# Patient Record
Sex: Female | Born: 1995 | Race: White | Hispanic: No | Marital: Single | Smoking: Never smoker
Health system: Southern US, Community
[De-identification: ages and names within clinical notes are randomized; demographics above are authoritative.]

## PROBLEM LIST (undated history)

## (undated) DIAGNOSIS — Z789 Other specified health status: Secondary | ICD-10-CM

## (undated) HISTORY — DX: Other specified health status: Z78.9

---
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
## 2018-06-02 LAB — HM PAP SMEAR: HM Pap smear: NEGATIVE

## 2019-03-16 ENCOUNTER — Ambulatory Visit (LOCAL_COMMUNITY_HEALTH_CENTER): Payer: Self-pay

## 2019-03-16 ENCOUNTER — Other Ambulatory Visit: Payer: Self-pay

## 2019-03-16 DIAGNOSIS — Z111 Encounter for screening for respiratory tuberculosis: Secondary | ICD-10-CM

## 2019-03-16 NOTE — Progress Notes (Signed)
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 Per [employer] records, pt had physical at [facility] on 06/02/2018. Donnal Moat, CNM completed form based on 06/02/2018 assessment; form sent for scanning. Pt to pick up original form on Friday when she comes back for PPDR.

## 2019-03-19 ENCOUNTER — Other Ambulatory Visit: Payer: Self-pay

## 2019-03-19 ENCOUNTER — Ambulatory Visit (LOCAL_COMMUNITY_HEALTH_CENTER): Payer: Self-pay

## 2019-03-19 DIAGNOSIS — Z111 Encounter for screening for respiratory tuberculosis: Secondary | ICD-10-CM

## 2019-03-19 LAB — TB SKIN TEST
Induration: 0 mm
TB Skin Test: NEGATIVE

## 2019-03-19 NOTE — Progress Notes (Signed)
Client received copy of physical form completed by E. Sciora CNM. Per attached note on form, form has been sent for scannng. Rich Number, RN

## 2020-01-28 ENCOUNTER — Other Ambulatory Visit: Payer: Self-pay

## 2020-01-28 ENCOUNTER — Ambulatory Visit: Payer: Self-pay

## 2020-01-28 ENCOUNTER — Ambulatory Visit (LOCAL_COMMUNITY_HEALTH_CENTER): Payer: BC Managed Care – PPO | Admitting: Physician Assistant

## 2020-01-28 VITALS — BP 97/61 | Ht 60.0 in | Wt 121.0 lb

## 2020-01-28 DIAGNOSIS — Z5321 Procedure and treatment not carried out due to patient leaving prior to being seen by health care provider: Secondary | ICD-10-CM

## 2020-01-28 NOTE — Progress Notes (Signed)
Patient had to leave due to work schedule before being seen by nurse and provider to finish visit patient was going to reschedule .......[facility]Charles [facility]

## 2020-01-28 NOTE — Progress Notes (Signed)
Patient with appointment for RP and Nexplanon removal/reinsertion today. 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  Offered option of staying for appointment and giving her work note vs rescheduling.  Patient opted to reschedule her appointment.

## 2020-02-04 ENCOUNTER — Ambulatory Visit: Payer: BC Managed Care – PPO

## 2020-02-04 ENCOUNTER — Ambulatory Visit (LOCAL_COMMUNITY_HEALTH_CENTER): Payer: BC Managed Care – PPO | Admitting: Physician Assistant

## 2020-02-04 ENCOUNTER — Encounter: Payer: Self-pay | Admitting: Physician Assistant

## 2020-02-04 ENCOUNTER — Other Ambulatory Visit: Payer: Self-pay

## 2020-02-04 VITALS — BP 97/62 | Ht 61.0 in | Wt 118.2 lb

## 2020-02-04 DIAGNOSIS — Z3009 Encounter for other general counseling and advice on contraception: Secondary | ICD-10-CM

## 2020-02-04 DIAGNOSIS — Z0000 Encounter for general adult medical examination without abnormal findings: Secondary | ICD-10-CM

## 2020-02-04 DIAGNOSIS — Z3046 Encounter for surveillance of implantable subdermal contraceptive: Secondary | ICD-10-CM | POA: Diagnosis not present

## 2020-02-04 MED ORDER — ETONOGESTREL 68 MG ~~LOC~~ IMPL
68.0000 mg | DRUG_IMPLANT | Freq: Once | SUBCUTANEOUS | Status: AC
  Administered 2020-02-04: 68 mg via SUBCUTANEOUS

## 2020-02-04 NOTE — Progress Notes (Signed)
Pt is here for physical and Nexplanon removal and reinsertion. Pt reports no issues with Nexplanon, but that device is expired and desires reinsertion. RN counseling for Nexplanon removal and reinsertion completed and consent forms reviewed and signed by pt.

## 2020-02-04 NOTE — Progress Notes (Signed)
Family Planning Visit- Repeat Yearly Visit  Subjective:  Tracy Alvarez is a 24 y.o. No obstetric history on file.  being seen today for an well woman visit and to discuss family planning options.    She is currently using Nexplanon for pregnancy prevention. Patient reports she does not  want a pregnancy in the next year. Patient  does not have a problem list on file.  Chief Complaint  Patient presents with  . Contraception    Physical, Nexplanon removal    Patient reports that she would like to have her Nexplanon removed and use condoms.  Per chart review, patient due for CBE and pap is due in 2022.  Patient denies any concerns today.    See flowsheet for other program required questions.   Body mass index is 22.33 kg/m. - Patient is eligible for diabetes screening based on BMI and age >72?  not applicable HA1C ordered? not applicable  Patient reports 1  partners in last year. Desires STI screening?  No - patient declines due to bleeding.   Has patient been screened once for HCV in the past?  No  No results found for: HCVAB  Does the patient have current of drug use, have a partner with drug use, and/or has been incarcerated since last result? No  If yes-- Screen for HCV through [facility]   Does the patient meet criteria for HBV testing? No  Criteria:  -Household, sexual or needle sharing contact with HBV -History of drug use -HIV positive -Those with known Hep C   Health Maintenance Due  Topic Date Due  . Hepatitis C Screening  Never done  . HIV Screening  Never done  . TETANUS/TDAP  Never done  . INFLUENZA VACCINE  Never done    Review of Systems  All other systems reviewed and are negative.   The following portions of the patient's history were reviewed and updated as appropriate: allergies, current medications, past family history, past medical history, past social history, past surgical history and problem list. Problem list updated.  Objective:    Vitals:   02/04/20 1350  BP: 97/62  Weight: 118 lb 3.2 oz (53.6 kg)  Height: 5\' 1"  (1.549 m)    Physical Exam Vitals and nursing note reviewed.  Constitutional:      General: She is not in acute distress.    Appearance: Normal appearance.  HENT:     Head: Normocephalic and atraumatic.  Eyes:     Conjunctiva/sclera: Conjunctivae normal.  Neck:     Thyroid: No thyroid mass, thyromegaly or thyroid tenderness.  Cardiovascular:     Rate and Rhythm: Normal rate and regular rhythm.  Pulmonary:     Effort: Pulmonary effort is normal.     Breath sounds: Normal breath sounds.  Chest:     Breasts:        Right: Normal. No mass, nipple discharge, skin change or tenderness.        Left: Normal. No mass, nipple discharge, skin change or tenderness.  Abdominal:     Palpations: Abdomen is soft. There is no mass.     Tenderness: There is no abdominal tenderness. There is no guarding or rebound.  Musculoskeletal:     Cervical back: Neck supple. No tenderness.  Lymphadenopathy:     Cervical: No cervical adenopathy.     Upper Body:     Right upper body: No supraclavicular, axillary or pectoral adenopathy.     Left upper body: No supraclavicular, axillary  or pectoral adenopathy.  Skin:    General: Skin is warm and dry.  Neurological:     Mental Status: She is alert and oriented to person, place, and time.  Psychiatric:        Mood and Affect: Mood normal.        Behavior: Behavior normal.        Thought Content: Thought content normal.        Judgment: Judgment normal.       Assessment and Plan:  Tracy Alvarez is a 24 y.o. female No obstetric history on file. presenting to the [facility] Department for an yearly well woman exam/family planning visit  Contraception counseling: Reviewed all forms of birth control options in the tiered based approach. available including abstinence; over the counter/barrier methods; hormonal contraceptive medication including pill,  patch, ring, injection,contraceptive implant, ECP; hormonal and nonhormonal IUDs; permanent sterilization options including vasectomy and the various tubal sterilization modalities. Risks, benefits, and typical effectiveness rates were reviewed.  Questions were answered.  Written information was also given to the patient to review.  Patient desires Nexplanon removal/reinsertion, this was prescribed for patient. She will follow up in  1 year and prn for surveillance.  She was told to call with any further questions, or with any concerns about this method of contraception.  Emphasized use of condoms 100% of the time for STI prevention.  Patient was not a candidate for ECP today.    1. Encounter for counseling regarding contraception Counseled patient re:  BCMs and patient then decides to have reinsertion. Rec condoms with all sex for 10 days after reinsertion and enc always for STD protection.  2. Well woman exam (no gynecological exam) Reviewed healthy habits for general health and to maintain normal BMI. Enc MVI 1 po daily. Enc to establish with/follow up with PCP for primary care concerns and illness.  3. Encounter for removal and reinsertion of Nexplanon Nexplanon Removal and Insertion  Patient identified, informed consent performed, consent signed.   Patient does understand that irregular bleeding is a very common side effect of this medication. She was advised to have backup contraception for one week after replacement of the implant. Patient deemed to meet WHO criteria for being reasonably certain she is not pregnant.  Appropriate time out taken. Nexplanon site identified. Area prepped in usual sterile fashon. 2 ml of 1% lidocaine with epinephrine was used to anesthetize the area at the distal end of the implant. A small stab incision was made right beside the implant on the distal portion. The Nexplanon rod was grasped manually and removed without difficulty. There was minimal blood loss. There  were no complications.   Confirmed correct location of insertion site. The insertion site was identified 8-10 cm (3-4 inches) from the medial epicondyle of the humerus and 3-5 cm (1.25-2 inches) posterior to (below) the sulcus (groove) between the biceps and triceps muscles of the patient's left arm. New Nexplanon removed from packaging, Device confirmed in needle, then inserted full length of needle and withdrawn per handbook instructions. Nexplanon was able to palpated in the patient's left arm; patient palpated the insert herself.  There was minimal blood loss. Patient insertion site covered with guaze and a pressure bandage to reduce any bruising. The patient tolerated the procedure well and was given post procedure instructions.   Nexplanon:   Counseled patient to take OTC analgesic starting as soon as lidocaine starts to wear off and take regularly for at least 48 hr to  decrease discomfort.  Specifically to take with food or milk to decrease stomach upset and for IB 600 mg (3 tablets) every 6 hrs; IB 800 mg (4 tablets) every 8 hrs; or Aleve 2 tablets every 12 hrs.   - etonogestrel (NEXPLANON) implant 68 mg     Return in about 1 year (around 02/03/2021) for RP and prn.  No future appointments.  Matt Holmes, PA

## 2021-06-19 ENCOUNTER — Ambulatory Visit (LOCAL_COMMUNITY_HEALTH_CENTER): Payer: 59 | Admitting: Family Medicine

## 2021-06-19 ENCOUNTER — Other Ambulatory Visit: Payer: Self-pay

## 2021-06-19 VITALS — BP 90/53 | HR 74 | Temp 98.3°F | Resp 16 | Ht 61.0 in | Wt 139.0 lb

## 2021-06-19 DIAGNOSIS — Z3009 Encounter for other general counseling and advice on contraception: Secondary | ICD-10-CM | POA: Diagnosis not present

## 2021-06-19 DIAGNOSIS — Z3049 Encounter for surveillance of other contraceptives: Secondary | ICD-10-CM | POA: Diagnosis not present

## 2021-06-19 DIAGNOSIS — Z113 Encounter for screening for infections with a predominantly sexual mode of transmission: Secondary | ICD-10-CM

## 2021-06-19 DIAGNOSIS — Z01419 Encounter for gynecological examination (general) (routine) without abnormal findings: Secondary | ICD-10-CM

## 2021-06-19 LAB — WET PREP FOR TRICH, YEAST, CLUE
Trichomonas Exam: NEGATIVE
Yeast Exam: NEGATIVE

## 2021-06-19 NOTE — Progress Notes (Signed)
Patient here for annual exam and Nexplanon removal.   Nexplanon was placed on 02/04/20 t [facility] and removed today by provider K. Alvester Morin, MD. After-care instructions given to patient and patient verbalized understating. Patient to practice abstinence - abstinence form filled.   MyChart set up today and patient aware to check there for results for PAP and gonorrhea/chlamydia.   Wet mount reviewed during clinic visit - no treatment indicated.   Floy Sabina, RN

## 2021-06-19 NOTE — Progress Notes (Signed)
[name]    Family Planning Visit- Initial Visit  Subjective:  Tracy Alvarez is a 26 y.o.  [id number]   being seen today for an initial annual visit and to discuss reproductive life planning.  The patient is currently using No Method - Other Reason for pregnancy prevention. Patient reports   does not want a pregnancy in the next year.  Patient has the following medical conditions does not have a problem list on file.  Chief Complaint  Patient presents with   Annual Exam   Contraception    Patient report no changes in health status.     Body mass index is 26.26 kg/m. - Patient is eligible for diabetes screening based on BMI and age >40?  no HA1C ordered? no  Patient reports 1  partner/s in last year. Desires STI screening?  Yes  Has patient been screened once for HCV in the past?  No  No results found for: HCVAB  Does the patient have current drug use (including MJ), have a partner with drug use, and/or has been incarcerated since last result? No - declined blood work screening today If yes-- Screen for HCV through [facility]   Does the patient meet criteria for HBV testing? No  Criteria:  -Household, sexual or needle sharing contact with HBV -History of drug use -HIV positive -Those with known Hep C   Health Maintenance Due  Topic Date Due   COVID-19 Vaccine (1) Never done   HPV VACCINES (1 - 2-dose series) Never done   HIV Screening  Never done   Hepatitis C Screening  Never done   TETANUS/TDAP  Never done   INFLUENZA VACCINE  Never done   PAP-Cervical Cytology Screening  06/02/2021   PAP SMEAR-Modifier  06/02/2021    Review of Systems  Constitutional:  Negative for chills and fever.  Eyes:  Negative for blurred vision and double vision.  Respiratory:  Negative for cough and shortness of breath.   Cardiovascular:  Negative for chest pain and orthopnea.   Gastrointestinal:  Negative for nausea and vomiting.  Genitourinary:  Negative for dysuria, flank pain and frequency.  Musculoskeletal:  Negative for myalgias.  Skin:  Negative for rash.  Neurological:  Negative for dizziness, tingling, weakness and headaches.  Endo/Heme/Allergies:  Does not bruise/bleed easily.  Psychiatric/Behavioral:  Negative for depression and suicidal ideas. The patient is not nervous/anxious.    The following portions of the patient's history were reviewed and updated as appropriate: allergies, current medications, past family history, past medical history, past social history, past surgical history and problem list. Problem list updated.   See flowsheet for other program required questions.  Objective:   Vitals:   06/19/21 1505  BP: (!) 90/53  Pulse: 74  Resp: 16  Temp: 98.3 F (36.8 C)  TempSrc: Oral  Weight: 139 lb (63 kg)  Height: 5\' 1"  (1.549 m)    Physical Exam Constitutional:      Appearance: Normal appearance.  HENT:     Head: Normocephalic and atraumatic.  Pulmonary:     Effort: Pulmonary effort is normal.  Abdominal:     Palpations: Abdomen is soft.  Musculoskeletal:        General: Normal range of motion.  Skin:    General: Skin is warm and dry.  Neurological:     General: No focal deficit present.     Mental Status: She is alert.  Psychiatric:        Mood and Affect: Mood normal.        Behavior: Behavior normal.    Nexplanon Removal Patient identified, informed consent performed, consent signed.   Appropriate time out taken. Nexplanon site identified.  Area prepped in usual sterile fashon. 3 ml of 1% lidocaine with Epinephrine was used to anesthetize the area at the distal end of the implant and along implant site. A small stab incision was made right beside the implant on the distal portion.  The Nexplanon rod was grasped using hemostats and removed without difficulty.  There was minimal blood loss. There were no complications.   Steri-strips were applied over the small incision.  A pressure bandage was applied to reduce any bruising.  The patient tolerated the procedure well and was given post procedure instructions.      Assessment and Plan:  Tracy Alvarez is a 26 y.o. female presenting to the [facility] Department for an initial annual wellness/contraceptive visit  Contraception counseling: Reviewed all forms of birth control options in the tiered based approach. available including abstinence; over the counter/barrier methods; hormonal contraceptive medication including pill, patch, ring, injection,contraceptive implant, ECP; hormonal and nonhormonal IUDs; permanent sterilization options including vasectomy and the various tubal sterilization modalities. Risks, benefits, and typical effectiveness rates were reviewed.  Questions were answered.  Written information was also given to the patient to review.  Patient desires Abstinence, this was prescribed for patient.    The patient will follow up in  58yr for surveillance.  The patient was told to call with any further questions, or with any concerns about this method of contraception.  Emphasized use of condoms 100% of the time for STI prevention.  1. Screening examination for venereal disease - WET PREP FOR [place], YEAST, CLUE  2. Well woman exam with routine gynecological exam Breast exam not indicated Pap today Desires STI screening- reports vaginal discharge - Chlamydia/Gonorrhea [facility] - IGP, rfx Aptima HPV ASCU     Return in about 1 year (around 06/19/2022) for Yearly wellness exam.  No future appointments.  Caren Macadam, MD

## 2021-06-21 LAB — IGP, RFX APTIMA HPV ASCU: PAP Smear Comment: 0

## 2021-12-28 ENCOUNTER — Ambulatory Visit: Admit: 2021-12-28 | Payer: Self-pay

## 2022-12-11 ENCOUNTER — Ambulatory Visit (LOCAL_COMMUNITY_HEALTH_CENTER): Payer: Medicaid Other | Admitting: Advanced Practice Midwife

## 2022-12-11 ENCOUNTER — Encounter: Payer: Self-pay | Admitting: Advanced Practice Midwife

## 2022-12-11 VITALS — BP 101/67 | HR 55 | Ht 61.0 in | Wt 130.0 lb

## 2022-12-11 DIAGNOSIS — Z309 Encounter for contraceptive management, unspecified: Secondary | ICD-10-CM

## 2022-12-11 DIAGNOSIS — Z3009 Encounter for other general counseling and advice on contraception: Secondary | ICD-10-CM

## 2022-12-11 DIAGNOSIS — Z01419 Encounter for gynecological examination (general) (routine) without abnormal findings: Secondary | ICD-10-CM

## 2022-12-11 DIAGNOSIS — F1290 Cannabis use, unspecified, uncomplicated: Secondary | ICD-10-CM | POA: Insufficient documentation

## 2022-12-11 NOTE — Progress Notes (Signed)
[facility] DEPARTMENT [facility] [address] Main Number: [phone]   Family Planning Visit- Initial Visit  Subjective:  Tracy Alvarez is a 27 y.o. SWF nonsmoker G0P0000   being seen today for an initial annual visit and to discuss reproductive life planning.  The patient is currently using No Method - Other Reason for pregnancy prevention. Patient reports   does not want a pregnancy in the next year.     report they are looking for a method that provides Other doesn't want any birth control  Patient has the following medical conditions has Marijuana use on their problem list.  Chief Complaint  Patient presents with   Annual Exam    Patient reports here for physical only. LMP yesterday. Last sex 12/08/22 without condom; with current partner x 5 mo; 1 partner in last 3 mo. Doesn't want pregnancy in next year. Last MJ yesterday. Last ETOH 12/07/22 (5 shots Tequila) q weekends. Working 40 hrs/wk and living alone. Last dental exam 01/2022. Last PE 06/19/21. Last pap 06/19/21 neg.   Patient denies cigs, vaping, cigars  Body mass index is 24.56 kg/m. - Patient is eligible for diabetes screening based on BMI> 25 and age >35?  not applicable HA1C ordered? no  Patient reports 1  partner/s in last year. Desires STI screening?  No - declines bloodwork  Has patient been screened once for HCV in the past?  No  No results found for: "HCVAB"  Does the patient have current drug use (including MJ), have a partner with drug use, and/or has been incarcerated since last result? Yes  If yes-- Screen for HCV through [facility]   Does the patient meet criteria for HBV testing? Yes  Criteria:  -Household, sexual or needle sharing contact with HBV -History of drug use -HIV positive -Those with known Hep C   Health Maintenance Due  Topic Date Due   COVID-19 Vaccine (1) Never done   HIV Screening  Never done   Hepatitis C Screening  Never done    DTaP/Tdap/Td (1 - Tdap) Never done    Review of Systems  All other systems reviewed and are negative.   The following portions of the patient's history were reviewed and updated as appropriate: allergies, current medications, past family history, past medical history, past social history, past surgical history and problem list. Problem list updated.   See flowsheet for other program required questions.  Objective:   Vitals:   12/11/22 1436  BP: 101/67  Pulse: (!) 55  Weight: 130 lb (59 kg)  Height: 5\' 1"  (1.549 m)    Physical Exam Constitutional:      Appearance: Normal appearance. She is normal weight.  HENT:     Head: Normocephalic and atraumatic.     Mouth/Throat:     Mouth: Mucous membranes are moist.     Comments: Good dentition; last dental exam 01/2022 Eyes:     Conjunctiva/sclera: Conjunctivae normal.  Neck:     Thyroid: No thyroid mass, thyromegaly or thyroid tenderness.  Cardiovascular:     Rate and Rhythm: Normal rate and regular rhythm.  Pulmonary:     Effort: Pulmonary effort is normal.     Breath sounds: Normal breath sounds.  Chest:  Breasts:    Right: Normal.     Left: Normal.  Abdominal:     General: Abdomen is flat.     Palpations: Abdomen is soft.     Comments: Soft without masses or tenderness,  good tone  Genitourinary:    Rectum: Normal.     Comments: Pt states began menses yesterday and wants to return next week for wet mount and GC/Chlamydia; pap not due Musculoskeletal:        General: Normal range of motion.     Cervical back: Normal range of motion and neck supple.  Skin:    General: Skin is warm and dry.  Neurological:     Mental Status: She is alert.  Psychiatric:        Mood and Affect: Mood normal.      Assessment and Plan:  Tracy Alvarez is a 27 y.o. female presenting to the [facility] Department for an initial annual wellness/contraceptive visit  Contraception counseling: Reviewed options based on patient  desire and reproductive life plan. Patient is interested in Female Condom. This was provided to the patient today.  if not why not clearly documented  Risks, benefits, and typical effectiveness rates were reviewed.  Questions were answered.  Written information was also given to the patient to review.    The patient will follow up in  1 weeks for surveillance.  The patient was told to call with any further questions, or with any concerns about this method of contraception.  Emphasized use of condoms 100% of the time for STI prevention.  Educated on ECP and assessed for need of ECP. Patient reported > 120 hours .  Reviewed options and patient desired No method of ECP, declined all    1. Family planning Pt desires to return after menses completed in 1 week for STD testing and bimanual exam Please give pt condoms   Return if symptoms worsen or fail to improve.  No future appointments.  Alberteen Spindle, CNM

## 2022-12-18 ENCOUNTER — Ambulatory Visit: Payer: Self-pay

## 2022-12-31 ENCOUNTER — Ambulatory Visit: Payer: Self-pay | Admitting: Advanced Practice Midwife

## 2022-12-31 ENCOUNTER — Encounter: Payer: Self-pay | Admitting: Advanced Practice Midwife

## 2022-12-31 DIAGNOSIS — Z113 Encounter for screening for infections with a predominantly sexual mode of transmission: Secondary | ICD-10-CM

## 2022-12-31 LAB — HM HIV SCREENING LAB: HM HIV Screening: NEGATIVE

## 2022-12-31 NOTE — Progress Notes (Signed)
[facility] Department  STI clinic/screening visit [address] [place] [address] [phone]  Subjective:  Tracy Alvarez is a 27 y.o. SWF nullip nonsmoker female being seen today for an STI screening visit. The patient reports they do not have symptoms.  Patient reports that they do not desire a pregnancy in the next year.   They reported they are not interested in discussing contraception today.    Patient's last menstrual period was 12/11/2022 (exact date).  Patient has the following medical conditions:   Patient Active Problem List   Diagnosis Date Noted   Marijuana use 12/11/2022    Chief Complaint  Patient presents with   SEXUALLY TRANSMITTED DISEASE    HPI  Patient reports asymptomatic. LMP 12/11/22. Last sex yesterday with condom; with current partner x 5 mo; 1 partner in last 3 mo. Last ETOH 12/29/22 (4 Mimosas) q weekend. Last MJ 12/29/22. Last pap 06/19/21 neg  Does the patient using douching products? No  Last HIV test per patient/review of record was No results found for: "HMHIVSCREEN" No results found for: "HIV" Patient reports last pap was No results found for: "DIAGPAP"  Lab Results  Component Value Date   SPECADGYN Comment 06/19/2021    Screening for MPX risk: Does the patient have an unexplained rash? No Is the patient MSM? No Does the patient endorse multiple sex partners or anonymous sex partners? No Did the patient have close or sexual contact with a person diagnosed with MPX? No Has the patient traveled outside the [place] where MPX is endemic? No Is there a high clinical suspicion for MPX-- evidenced by one of the following No  -Unlikely to be chickenpox  -Lymphadenopathy  -Rash that present in same phase of evolution on any given body part See flowsheet for further details and programmatic requirements.   Immunization history:  Immunization History  Administered Date(s) Administered   PPD Test 03/16/2019     The  following portions of the patient's history were reviewed and updated as appropriate: allergies, current medications, past medical history, past social history, past surgical history and problem list.  Objective:  There were no vitals filed for this visit.  Physical Exam Vitals and nursing note reviewed.  Constitutional:      Appearance: Normal appearance. Tracy Alvarez is normal weight.  HENT:     Head: Normocephalic and atraumatic.     Mouth/Throat:     Mouth: Mucous membranes are moist.     Pharynx: Oropharynx is clear. No oropharyngeal exudate or posterior oropharyngeal erythema.  Eyes:     Conjunctiva/sclera: Conjunctivae normal.  Pulmonary:     Effort: Pulmonary effort is normal.  Abdominal:     General: Abdomen is flat.     Palpations: Abdomen is soft. There is no mass.     Tenderness: There is no abdominal tenderness. There is no rebound.     Comments: Soft without masses or tenderness, good tone  Genitourinary:    General: Normal vulva.     Exam position: Lithotomy position.     Pubic Area: No rash or pubic lice.      Labia:        Right: No rash or lesion.        Left: No rash or lesion.      Vagina: Vaginal discharge (white creamy leukorrhea, ph<4.5) present. No erythema, bleeding or lesions.     Cervix: No cervical motion tenderness, discharge, friability, lesion or erythema.     Uterus: Normal.  Adnexa: Right adnexa normal and left adnexa normal.     Rectum: Normal.     Comments: pH = <4.5 Lymphadenopathy:     Head:     Right side of head: No preauricular or posterior auricular adenopathy.     Left side of head: No preauricular or posterior auricular adenopathy.     Cervical: No cervical adenopathy.     Right cervical: No superficial, deep or posterior cervical adenopathy.    Left cervical: No superficial, deep or posterior cervical adenopathy.     Upper Body:     Right upper body: No supraclavicular, axillary or epitrochlear adenopathy.     Left upper body: No  supraclavicular, axillary or epitrochlear adenopathy.     Lower Body: No right inguinal adenopathy. No left inguinal adenopathy.  Skin:    General: Skin is warm and dry.     Findings: No rash.  Neurological:     Mental Status: Tracy Alvarez is alert and oriented to person, place, and time.      Assessment and Plan:  VANNETTE HELMICK is a 27 y.o. female presenting to the [facility] Department for STI screening  1. Screening examination for venereal disease Treat wet mount per standing orders Immunization nurse consult  - HIV [place] [facility] - Syphilis Serology, [employer] [facility] - Chlamydia/Gonorrhea [place] [facility] - Gonococcus culture - WET PREP FOR TRICH, YEAST, CLUE   Patient accepted all screenings including oral, vaginal CT/GC and bloodwork for HIV/RPR, and wet prep. Patient meets criteria for HepB screening? No. Ordered? no Patient meets criteria for HepC screening? No. Ordered? no  Treat wet prep per standing order Discussed time line for State Lab results and that patient will be called with positive results and encouraged patient to call if Tracy Alvarez had not heard in 2 weeks.  Counseled to return or seek care for continued or worsening symptoms Recommended repeat testing in 3 months with positive results. Recommended condom use with all sex  Patient is currently using  condoms  to prevent pregnancy.    Return if symptoms worsen or fail to improve.  No future appointments.  Alberteen Spindle, CNM

## 2022-12-31 NOTE — Progress Notes (Signed)
Wet prep reviewed with provider. Per provider, no treatment indicated.Burt Knack, RN

## 2023-01-15 ENCOUNTER — Telehealth: Payer: Self-pay

## 2023-01-15 NOTE — Telephone Encounter (Signed)
Pt notified of positive Chlamydia results. Treatment appointment made for 8/15 @ 1:00 pm.  Berdie Ogren, RN

## 2023-01-16 ENCOUNTER — Ambulatory Visit: Payer: Self-pay

## 2023-01-16 ENCOUNTER — Telehealth: Payer: Self-pay

## 2023-01-16 DIAGNOSIS — A749 Chlamydial infection, unspecified: Secondary | ICD-10-CM

## 2023-01-16 MED ORDER — AZITHROMYCIN 500 MG PO TABS
1000.0000 mg | ORAL_TABLET | Freq: Once | ORAL | Status: AC
Start: 2023-01-16 — End: 2023-01-16
  Administered 2023-01-16: 1000 mg via ORAL

## 2023-01-16 NOTE — Progress Notes (Signed)
Pt is here for treatment of Chlamydia.   Pt states that she has had unprotected sex. The patient was dispensed Azithromycin 500 mg #2 today. I provided counseling today regarding the medication. We discussed the medication, the side effects and when to call clinic. Patient given the opportunity to ask questions. Questions answered.   Chlamydia pamphlet and condoms given.  Berdie Ogren, RN

## 2023-01-16 NOTE — Telephone Encounter (Signed)
LM for pt to return my call

## 2023-01-16 NOTE — Telephone Encounter (Signed)
Pt says she threw up her treatment and wants to know what to do know, please call back

## 2023-01-17 ENCOUNTER — Telehealth: Payer: Self-pay | Admitting: Family Medicine

## 2023-01-17 NOTE — Telephone Encounter (Signed)
Pt states that she vomited about 2 hours and 15 minutes after taking her dose.  Informed pt that she doesn't need additional treatment at this time, however, she will need to be retested in 1-3 months.  Berdie Ogren, RN

## 2023-01-17 NOTE — Telephone Encounter (Signed)
Patient had an STI apt with CB and she would like CB to give her a call.

## 2023-10-14 ENCOUNTER — Encounter: Payer: Self-pay | Admitting: Family Medicine

## 2023-10-14 ENCOUNTER — Ambulatory Visit: Payer: Self-pay

## 2023-10-14 DIAGNOSIS — Z113 Encounter for screening for infections with a predominantly sexual mode of transmission: Secondary | ICD-10-CM

## 2023-10-14 LAB — WET PREP FOR TRICH, YEAST, CLUE
Clue Cell Exam: NEGATIVE
Trichomonas Exam: NEGATIVE
Yeast Exam: NEGATIVE

## 2023-10-14 NOTE — Progress Notes (Signed)
 [facility] Department STI clinic [address], [address] [place] [address] Main phone: [phone]  STI screening visit  Subjective:  Tracy Alvarez is a 28 y.o. female being seen today for an STI screening visit. The patient reports they do have symptoms.  Patient reports that they do not desire a pregnancy in the next year.   They reported they are not interested in discussing contraception today.    Patient's last menstrual period was 09/20/2023 (approximate).  Patient has the following medical conditions:  Patient Active Problem List   Diagnosis Date Noted   Marijuana use 12/11/2022   Chief Complaint  Patient presents with   SEXUALLY TRANSMITTED DISEASE    HPI Patient reports to clinic with c/o white discharge. States she was told last time to "eat yogurt".  Does the patient using douching products? No  See flowsheet for further details and programmatic requirements Hyperlink available at the top of the signed note in blue.  Flow sheet content below:  Pregnancy Intention Screening Does the patient want to become pregnant in the next year?: No Does the patient's partner want to become pregnant in the next year?: No Would the patient like to discuss contraceptive options today?: No Reason For STD Screen STD Screening: Has symptoms Have you ever had an STD?: No History of Antibiotic use in the past 2 weeks?: No STD Symptoms Discharge: Yes Risk Factors for Hep B Household, sexual, or needle sharing contact of a person infected with Hep B: No Sexual contact with a person who uses drugs not as prescribed?: No Currently or Ever used drugs not as prescribed: No HIV Positive: No PRep Patient: No Men who have sex with men: N/A Have Hepatitis C: No History of Incarceration: No History of Homeslessness?: No Anal sex following anal drug use?: No Risk Factors for Hep C Currently using drugs not as prescribed: No Sexual partner(s) currently  using drugs as not prescribed: No History of drug use: No HIV Positive: No People with a history of incarceration: No People born between the years of 55 and 68: N/A Abuse History Has patient ever been abused physically?: No Has patient ever been abused sexually?: No Does patient feel they have a problem with Anxiety?: Yes Does patient feel they have a problem with Depression?: No Referral to Behavioral Health: Declined Counseling Patient counseled to use condoms with all sex: Condoms declined RTC in 2-3 weeks for test results: Yes Clinic will call if test results abnormal before test result appt.: Yes Test results given to patient Patient counseled to use condoms with all sex: Condoms declined   Screening for MPX risk: Does the patient have an unexplained rash? No Is the patient MSM? No Does the patient endorse multiple sex partners or anonymous sex partners? No Did the patient have close or sexual contact with a person diagnosed with MPX? No Has the patient traveled outside the [place]  where MPX is endemic? No Is there a high clinical suspicion for MPX-- evidenced by one of the following No  -Unlikely to be chickenpox  -Lymphadenopathy  -Rash that present in same phase of evolution on any given body part  Screenings: Last HIV test per patient/review of record was  Lab Results  Component Value Date   HMHIVSCREEN Negative - Validated 12/31/2022   No results found for: "HIV"   Last HEPC test per patient/review of record was No results found for: "HMHEPCSCREEN" No components found for: "HEPC"   Last HEPB test per patient/review of  record was No components found for: "HMHEPBSCREEN"   Patient reports last pap was:   Lab Results  Component Value Date   SPECADGYN Comment 06/19/2021   Result Date Procedure Results Follow-ups  06/19/2021 IGP, rfx Aptima HPV ASCU DIAGNOSIS:: Comment Specimen adequacy:: Comment Clinician Provided ICD10: Comment Performed by:: Comment PAP Smear  Comment: . Note:: Comment Test Methodology: Comment PAP Reflex: Comment   06/02/2018 HM PAP SMEAR HM Pap smear: Negative     Immunization history:  Immunization History  Administered Date(s) Administered   Hepatitis A, Ped/Adol-2 Dose 01/26/2007   Hepatitis B, PED/ADOLESCENT 1995/06/21, 08/21/1995, 04/22/1996   MMR 07/27/1996, 10/15/2000   PPD Test 03/16/2019   Tdap 01/26/2007   Varicella 07/27/1996, 01/26/2007    The following portions of the patient's history were reviewed and updated as appropriate: allergies, current medications, past medical history, past social history, past surgical history and problem list.  Objective:  There were no vitals filed for this visit.  Physical Exam Vitals and nursing note reviewed. Exam conducted with a chaperone present Susanna Epley CNA).  Constitutional:      Appearance: Normal appearance.  HENT:     Head: Normocephalic and atraumatic.     Mouth/Throat:     Mouth: Mucous membranes are moist.     Pharynx: Oropharynx is clear. No oropharyngeal exudate or posterior oropharyngeal erythema.  Pulmonary:     Effort: Pulmonary effort is normal.  Abdominal:     General: Abdomen is flat.     Palpations: There is no mass.     Tenderness: There is no abdominal tenderness. There is no rebound.  Genitourinary:    General: Normal vulva.     Exam position: Lithotomy position.     Pubic Area: No rash or pubic lice.      Labia:        Right: No rash or lesion.        Left: No rash or lesion.      Vagina: Vaginal discharge present. No erythema, bleeding or lesions.     Cervix: No cervical motion tenderness, discharge, friability, lesion or erythema.     Uterus: Normal.      Adnexa: Right adnexa normal and left adnexa normal.     Rectum: Normal.     Comments: pH = 4  Small amt of discharge present  Lymphadenopathy:     Head:     Right side of head: No preauricular or posterior auricular adenopathy.     Left side of head: No preauricular or  posterior auricular adenopathy.     Cervical: No cervical adenopathy.     Upper Body:     Right upper body: No supraclavicular, axillary or epitrochlear adenopathy.     Left upper body: No supraclavicular, axillary or epitrochlear adenopathy.     Lower Body: No right inguinal adenopathy. No left inguinal adenopathy.  Skin:    General: Skin is warm and dry.     Findings: No rash.  Neurological:     Mental Status: She is alert and oriented to person, place, and time.     Assessment and Plan:  ANTWANIQUE JUNCKER is a 28 y.o. female presenting to the [facility] Department for STI screening  1. Screening for venereal disease (Primary)  - WET PREP FOR TRICH, YEAST, CLUE - Chlamydia/Gonorrhea [place] [facility]   Patient accepted the following screenings: vaginal CT/GC swabs and vaginal wet prep Patient meets criteria for HepB screening? No. Ordered? not applicable Patient meets criteria for HepC  screening? No. Ordered? not applicable  Treat wet prep per standing order Discussed time line for State Lab results and that patient will be called with positive results and encouraged patient to call if she had not heard in 2 weeks.  Counseled to return or seek care for continued or worsening symptoms Recommended repeat testing in 3 months with positive results. Recommended condom use with all sex for STI prevention.   Patient is currently using nothing to prevent pregnancy.    No follow-ups on file.  No future appointments.  Earleen Glazier, [place]

## 2023-10-20 ENCOUNTER — Ambulatory Visit: Payer: Self-pay | Admitting: Family Medicine

## 2023-10-28 ENCOUNTER — Telehealth: Payer: Self-pay

## 2023-10-28 NOTE — Telephone Encounter (Signed)
 Phone call to pt at [phone]. Pt answered and confirmed password. Counseled pt re + CT results and need for tx.  Tx appt scheduled for 10/29/23 per pt request.

## 2023-10-28 NOTE — Telephone Encounter (Signed)
 Call pt re positive chlamydia result from 10/14/23 vaginal specimen. Needs tx.

## 2023-10-29 ENCOUNTER — Ambulatory Visit: Payer: Self-pay

## 2023-10-29 DIAGNOSIS — A749 Chlamydial infection, unspecified: Secondary | ICD-10-CM

## 2023-10-29 MED ORDER — AZITHROMYCIN 500 MG PO TABS
1000.0000 mg | ORAL_TABLET | Freq: Once | ORAL | Status: AC
Start: 2023-10-29 — End: 2023-10-29
  Administered 2023-10-29: 1000 mg via ORAL

## 2023-10-29 NOTE — Progress Notes (Signed)
 In nurse clinic for chlamydia treatment. No birth control method. LMP 10/20/2023 (approx). Last sex 10/26/2023 and sex each week preceding for May.  Hx chlamydia 01/2023 and treated with azithromycin .   Consult Dr Bohdan Bush who advises treatment with azithromycin  d/t possibility of pregnancy since recent unprotected sex and no birth control method.  Advises patient to be re-screened in one month. If chlamydia has not cleared at that time, would recommend patient abstain from sex for 2 weeks and treat with doxycycline.   RN explained provider recommendations to patient. Questions answered and verbalizes understanding.   Treated today with azithromycin  1 gram by mouth once DOT. Advised patient to contact [facility] if vomits within 2 hrs of taking med. Azithromycin  info sheet given and reviewed. Jayline Kilburg, RN

## 2023-10-30 NOTE — Telephone Encounter (Signed)
 Pt kept 10/29/23 tx appt.

## 2023-10-30 NOTE — Progress Notes (Signed)
 Attestation of Attending Supervision of RN:  I was consulted regarding this patient case and agree with the documentation below.   Tempie Fee, MD Clinical Services Medical Director [facility] Department 10/30/23  10:50 AM

## 2023-12-14 ENCOUNTER — Other Ambulatory Visit: Payer: Self-pay

## 2023-12-14 ENCOUNTER — Emergency Department
Admission: EM | Admit: 2023-12-14 | Discharge: 2023-12-14 | Disposition: A | Discharge status: Home / Self Care | Payer: Self-pay | Attending: Emergency Medicine | Admitting: Emergency Medicine

## 2023-12-14 ENCOUNTER — Emergency Department: Payer: Self-pay

## 2023-12-14 DIAGNOSIS — S0990XA Unspecified injury of head, initial encounter: Secondary | ICD-10-CM | POA: Diagnosis not present

## 2023-12-14 DIAGNOSIS — R42 Dizziness and giddiness: Secondary | ICD-10-CM | POA: Diagnosis present

## 2023-12-14 DIAGNOSIS — Y92410 Unspecified street and highway as the place of occurrence of the external cause: Secondary | ICD-10-CM | POA: Insufficient documentation
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
